# Patient Record
Sex: Male | Born: 1935 | Race: White | Hispanic: No | Marital: Married
Health system: Southern US, Community
[De-identification: ages and names within clinical notes are randomized; demographics above are authoritative.]

---

## 2004-03-06 ENCOUNTER — Ambulatory Visit: Payer: Self-pay | Admitting: Unknown Physician Specialty

## 2007-01-09 ENCOUNTER — Emergency Department: Payer: Self-pay | Admitting: Emergency Medicine

## 2007-01-09 ENCOUNTER — Other Ambulatory Visit: Payer: Self-pay

## 2007-08-22 ENCOUNTER — Ambulatory Visit: Payer: Self-pay | Admitting: Otolaryngology

## 2007-09-12 ENCOUNTER — Ambulatory Visit: Payer: Self-pay | Admitting: Family Medicine

## 2008-12-03 ENCOUNTER — Emergency Department: Payer: Self-pay | Admitting: Emergency Medicine

## 2009-02-13 ENCOUNTER — Observation Stay: Payer: Self-pay | Admitting: Internal Medicine

## 2009-04-09 ENCOUNTER — Emergency Department: Payer: Self-pay | Admitting: Emergency Medicine

## 2009-05-09 ENCOUNTER — Emergency Department: Payer: Self-pay | Admitting: Unknown Physician Specialty

## 2009-05-19 ENCOUNTER — Ambulatory Visit: Payer: Self-pay | Admitting: Unknown Physician Specialty

## 2010-08-19 ENCOUNTER — Emergency Department: Payer: Self-pay | Admitting: Emergency Medicine

## 2010-08-31 ENCOUNTER — Ambulatory Visit: Payer: Self-pay | Admitting: Unknown Physician Specialty

## 2010-09-06 IMAGING — CR DG CHEST 2V
1 series · 2 of 2 positions shown · non-contrast
Comparison: none

REASON FOR EXAM: short of breath  -  ed waiting room
COMMENTS:   May transport without cardiac monitor

PROCEDURE:     DXR - DXR CHEST PA (OR AP) AND LATERAL  - April 09, 2009  [DATE]
RESULT:     Comparison: 02/12/2009, 01/09/2007

[Series 1: view not recorded · 0.17mm/px · 2 of 2 slices shown]
[im 1/2]
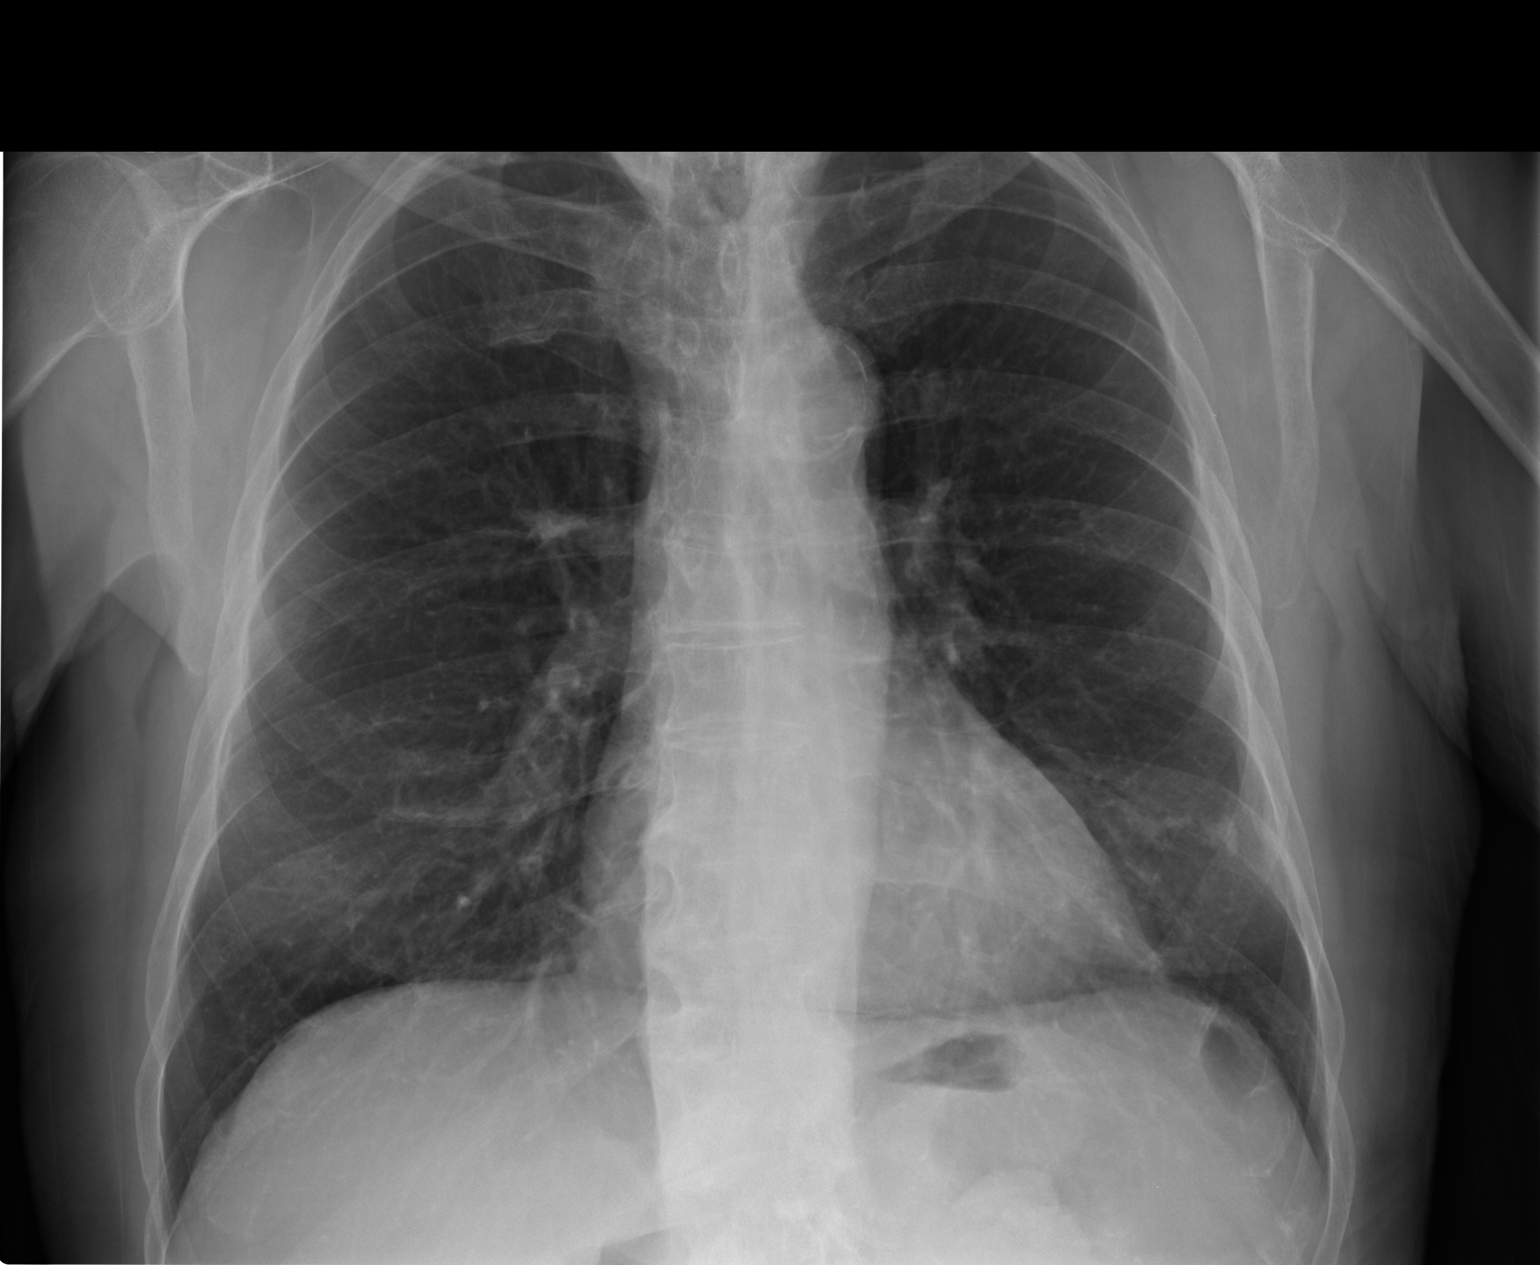
[im 2/2]
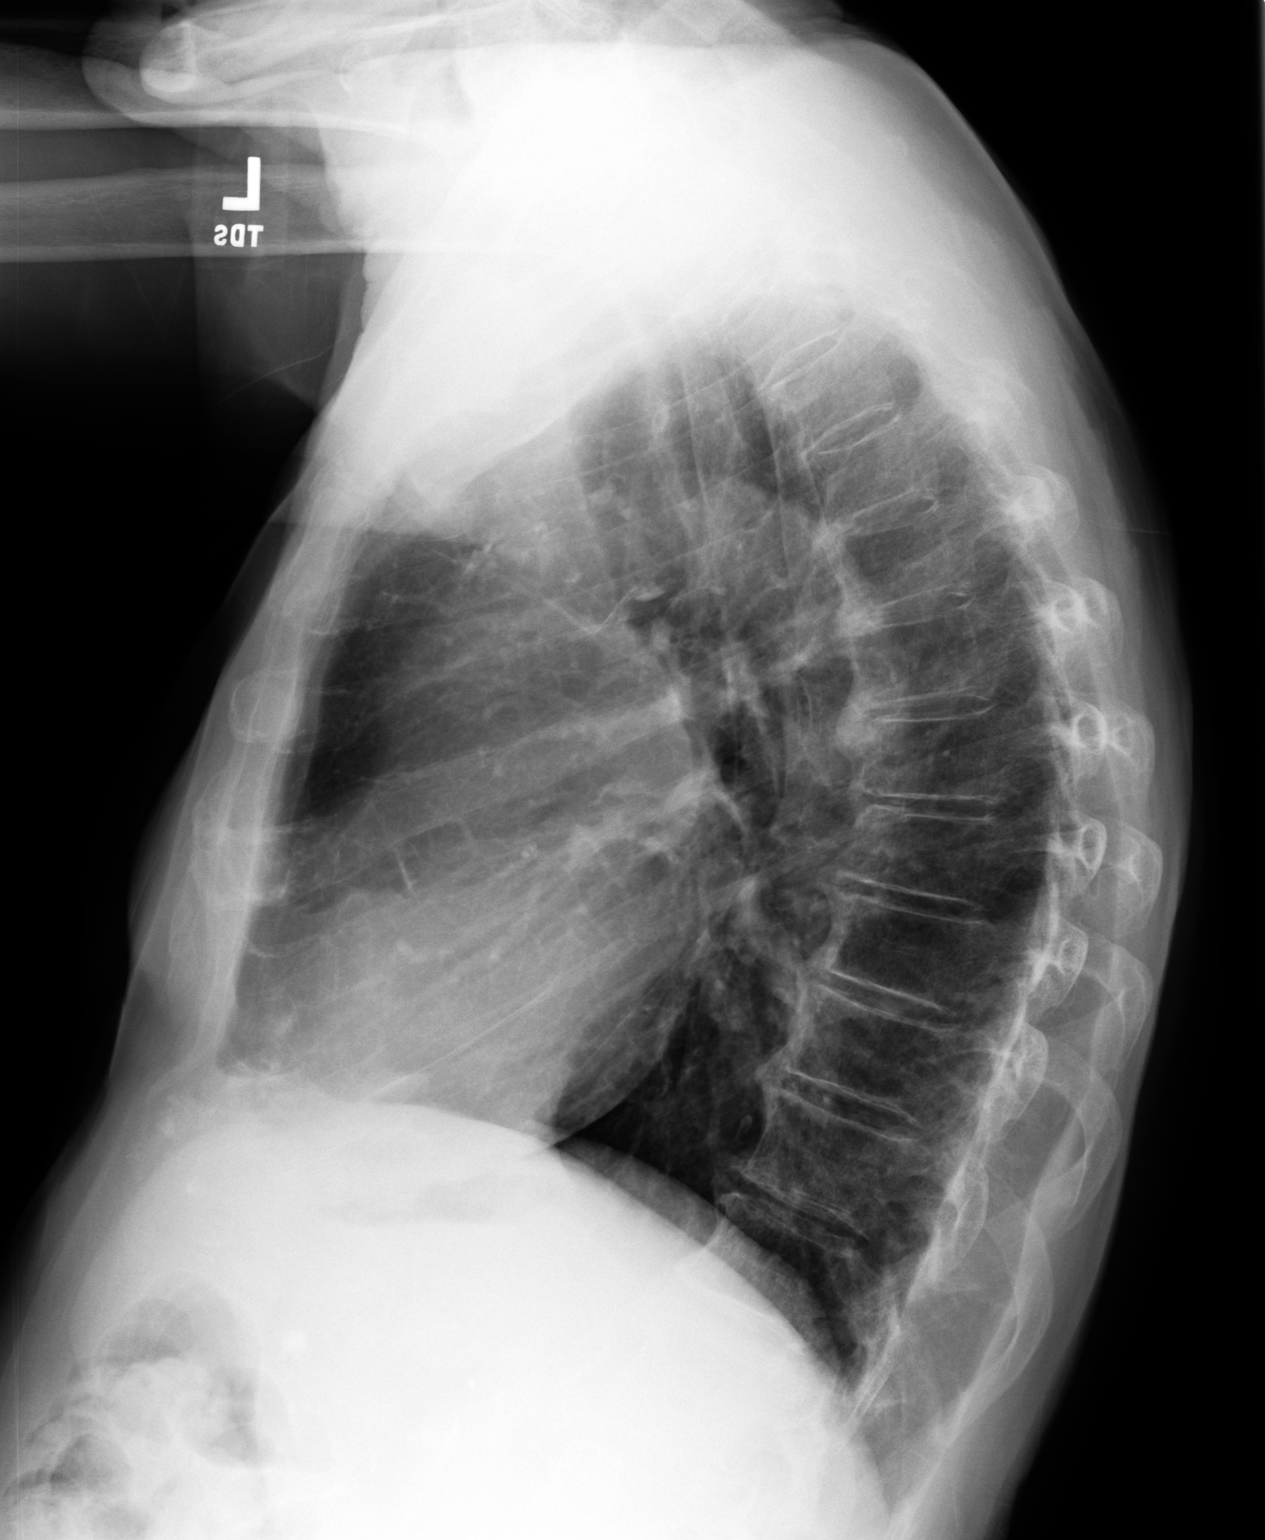

[2 of 2 positions shown; findings below may reference images not displayed]

FINDINGS: PA and lateral chest radiographs are provided. There is no focal parenchymal
opacity, pleural effusion, or pneumothorax. The heart and mediastinum are
unremarkable. The osseous structures are unremarkable.
IMPRESSION: No acute disease of the chest.

## 2010-11-16 ENCOUNTER — Inpatient Hospital Stay: Payer: Self-pay | Admitting: Orthopedic Surgery

## 2010-11-22 LAB — PATHOLOGY REPORT

## 2011-01-01 ENCOUNTER — Encounter: Payer: Self-pay | Admitting: Cardiothoracic Surgery

## 2011-01-01 ENCOUNTER — Encounter: Payer: Self-pay | Admitting: Nurse Practitioner

## 2011-01-19 ENCOUNTER — Inpatient Hospital Stay: Payer: Self-pay | Admitting: Specialist

## 2011-02-01 ENCOUNTER — Encounter: Payer: Self-pay | Admitting: Nurse Practitioner

## 2011-02-01 ENCOUNTER — Encounter: Payer: Self-pay | Admitting: Cardiothoracic Surgery

## 2011-03-03 ENCOUNTER — Encounter: Payer: Self-pay | Admitting: Cardiothoracic Surgery

## 2011-03-03 ENCOUNTER — Encounter: Payer: Self-pay | Admitting: Nurse Practitioner

## 2011-04-03 ENCOUNTER — Encounter: Payer: Self-pay | Admitting: Cardiothoracic Surgery

## 2011-04-03 ENCOUNTER — Encounter: Payer: Self-pay | Admitting: Nurse Practitioner

## 2011-04-14 LAB — WOUND CULTURE

## 2011-05-04 ENCOUNTER — Encounter: Payer: Self-pay | Admitting: Nurse Practitioner

## 2011-05-04 ENCOUNTER — Encounter: Payer: Self-pay | Admitting: Cardiothoracic Surgery

## 2011-06-01 ENCOUNTER — Encounter: Payer: Self-pay | Admitting: Nurse Practitioner

## 2011-06-01 ENCOUNTER — Encounter: Payer: Self-pay | Admitting: Cardiothoracic Surgery

## 2011-06-20 ENCOUNTER — Emergency Department: Payer: Self-pay | Admitting: Emergency Medicine

## 2011-06-20 LAB — COMPREHENSIVE METABOLIC PANEL
Albumin: 3.6 g/dL (ref 3.4–5.0)
Alkaline Phosphatase: 57 U/L (ref 50–136)
BUN: 14 mg/dL (ref 7–18)
Bilirubin,Total: 0.4 mg/dL (ref 0.2–1.0)
Calcium, Total: 8.9 mg/dL (ref 8.5–10.1)
Chloride: 97 mmol/L — ABNORMAL LOW (ref 98–107)
Co2: 26 mmol/L (ref 21–32)
Creatinine: 0.83 mg/dL (ref 0.60–1.30)
EGFR (Non-African Amer.): >60
Glucose: 201 mg/dL — ABNORMAL HIGH (ref 65–99)
Osmolality: 274 (ref 275–301)
Potassium: 3.8 mmol/L (ref 3.5–5.1)
SGOT(AST): 24 U/L (ref 15–37)
Sodium: 134 mmol/L — ABNORMAL LOW (ref 136–145)
Total Protein: 7 g/dL (ref 6.4–8.2)

## 2011-06-20 LAB — CBC
HCT: 33.9 % — ABNORMAL LOW (ref 40.0–52.0)
MCV: 89 fL (ref 80–100)
Platelet: 173 10*3/uL (ref 150–440)
RBC: 3.82 10*6/uL — ABNORMAL LOW (ref 4.40–5.90)
RDW: 12.9 % (ref 11.5–14.5)
WBC: 7.1 10*3/uL (ref 3.8–10.6)

## 2011-06-20 LAB — PROTIME-INR
INR: 1
Prothrombin Time: 13.7 secs (ref 11.5–14.7)

## 2011-06-20 LAB — CK TOTAL AND CKMB (NOT AT ARMC)
CK, Total: 65 U/L (ref 35–232)
CK-MB: 1.2 ng/mL (ref 0.5–3.6)

## 2011-06-20 LAB — URINALYSIS, COMPLETE
Bilirubin,UR: NEGATIVE
Glucose,UR: NEGATIVE mg/dL (ref 0–75)
Ketone: NEGATIVE
Nitrite: POSITIVE
Specific Gravity: 1.006 (ref 1.003–1.030)
Squamous Epithelial: <1

## 2011-06-20 LAB — TROPONIN I: Troponin-I: <0.02 ng/mL

## 2011-06-20 LAB — APTT: Activated PTT: 34.2 secs (ref 23.6–35.9)

## 2011-06-20 LAB — PRO B NATRIURETIC PEPTIDE: B-Type Natriuretic Peptide: 1076 pg/mL — ABNORMAL HIGH (ref 0–450)

## 2011-07-02 ENCOUNTER — Encounter: Payer: Self-pay | Admitting: Nurse Practitioner

## 2011-07-02 ENCOUNTER — Encounter: Payer: Self-pay | Admitting: Cardiothoracic Surgery

## 2011-09-14 ENCOUNTER — Emergency Department: Payer: Self-pay | Admitting: Unknown Physician Specialty

## 2011-09-14 LAB — URINALYSIS, COMPLETE
Bilirubin,UR: NEGATIVE
Glucose,UR: NEGATIVE mg/dL (ref 0–75)
Hyaline Cast: 1
Ketone: NEGATIVE
Nitrite: NEGATIVE
Ph: 7 (ref 4.5–8.0)
Protein: NEGATIVE
Specific Gravity: 1.008 (ref 1.003–1.030)
Squamous Epithelial: NONE SEEN
WBC UR: 10 /HPF (ref 0–5)

## 2011-09-14 LAB — CBC
HCT: 31 % — ABNORMAL LOW (ref 40.0–52.0)
MCH: 30.4 pg (ref 26.0–34.0)
Platelet: 222 10*3/uL (ref 150–440)
RBC: 3.52 10*6/uL — ABNORMAL LOW (ref 4.40–5.90)

## 2011-09-14 LAB — COMPREHENSIVE METABOLIC PANEL
Albumin: 3.5 g/dL (ref 3.4–5.0)
Anion Gap: 9 (ref 7–16)
BUN: 21 mg/dL — ABNORMAL HIGH (ref 7–18)
Bilirubin,Total: 0.4 mg/dL (ref 0.2–1.0)
Co2: 26 mmol/L (ref 21–32)
Creatinine: 0.97 mg/dL (ref 0.60–1.30)
EGFR (Non-African Amer.): >60
Glucose: 84 mg/dL (ref 65–99)
Osmolality: 270 (ref 275–301)
SGPT (ALT): 18 U/L
Sodium: 134 mmol/L — ABNORMAL LOW (ref 136–145)
Total Protein: 6.9 g/dL (ref 6.4–8.2)

## 2011-09-14 LAB — PROTIME-INR
INR: 1
Prothrombin Time: 13.1 secs (ref 11.5–14.7)

## 2011-09-17 LAB — URINE CULTURE

## 2011-09-24 ENCOUNTER — Ambulatory Visit: Payer: Self-pay | Admitting: Unknown Physician Specialty

## 2012-02-23 ENCOUNTER — Other Ambulatory Visit: Payer: Self-pay | Admitting: Internal Medicine

## 2012-05-03 DEATH — deceased

## 2012-11-16 IMAGING — CT CT HEAD WITHOUT CONTRAST
2 of 4 series · 16 of 30 positions shown, 19 images · non-contrast
Comparison: none

REASON FOR EXAM: weaker
COMMENTS:

PROCEDURE:     CT  - CT HEAD WITHOUT CONTRAST  - June 20, 2011 [DATE]
RESULT:     Head CT dated 06/20/2011
TECHNIQUE: Helical noncontrasted 5 mm sections were obtained from the skull
base to the vertex. This study was compared to previous study dated
02/13/2009.

[Series 2: without · axial · non-contrast · 0.39mm/px · z∈[+354,+478]mm · 11 of 31 slices shown, 14 images]
[im 3/31  brain]
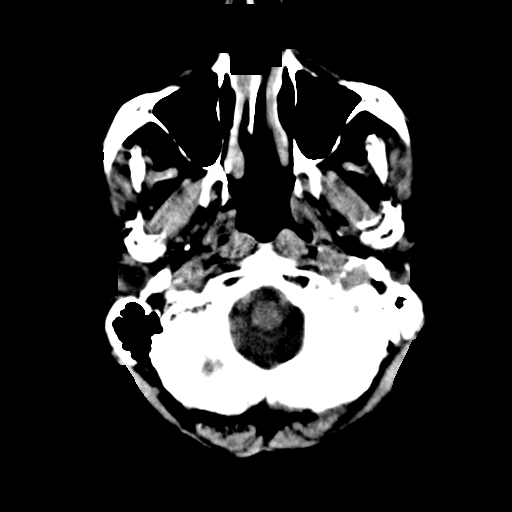
[im 3/31  bone]
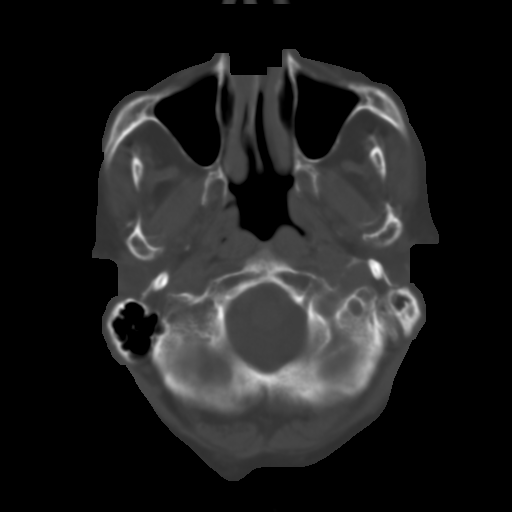
[im 6/31  brain]
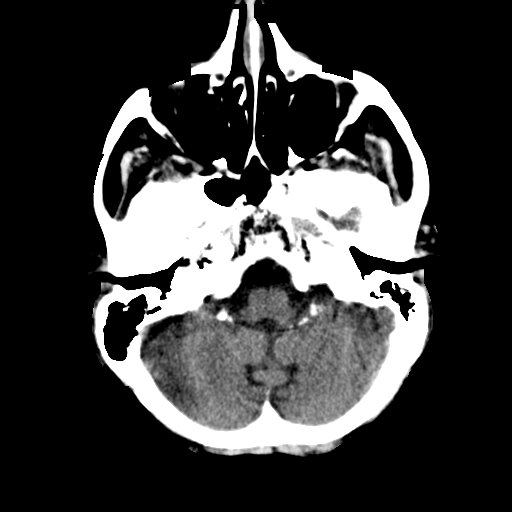
[im 8/31  brain]
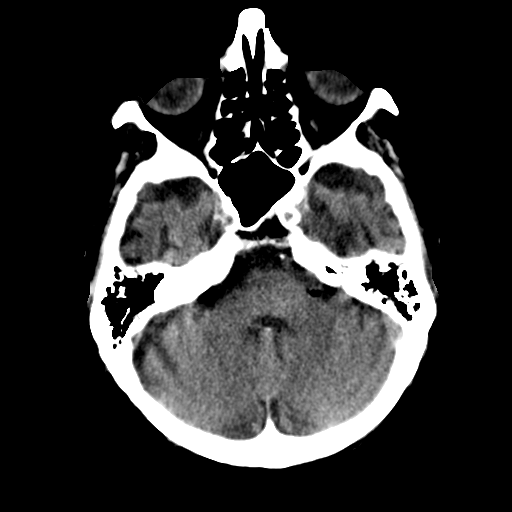
[im 11/31  brain]
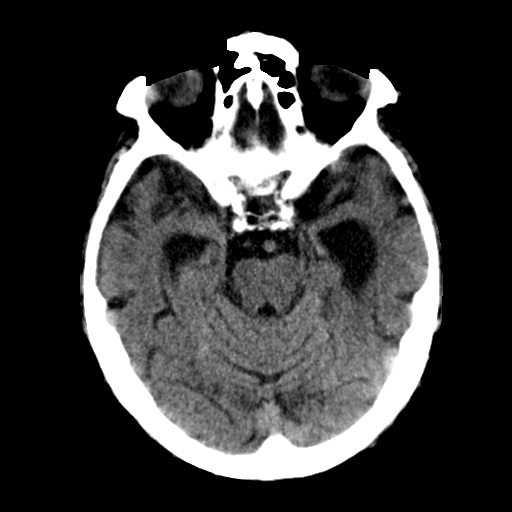
[im 13/31  brain]
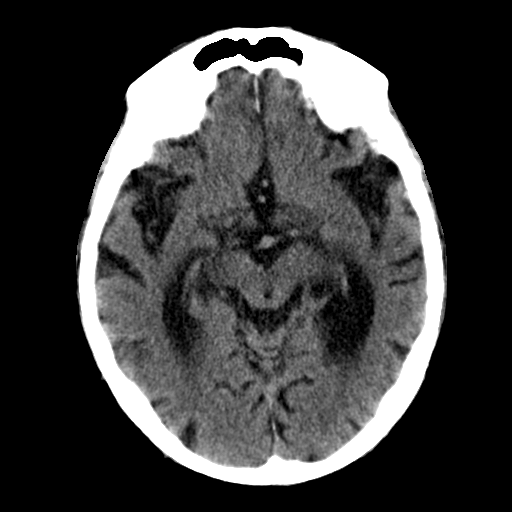
[im 13/31  bone]
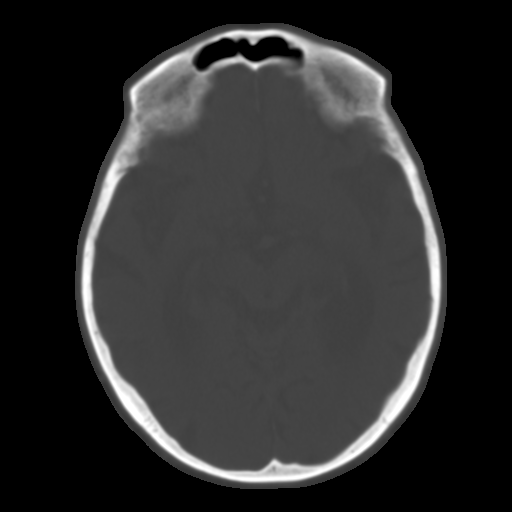
[im 16/31  brain]
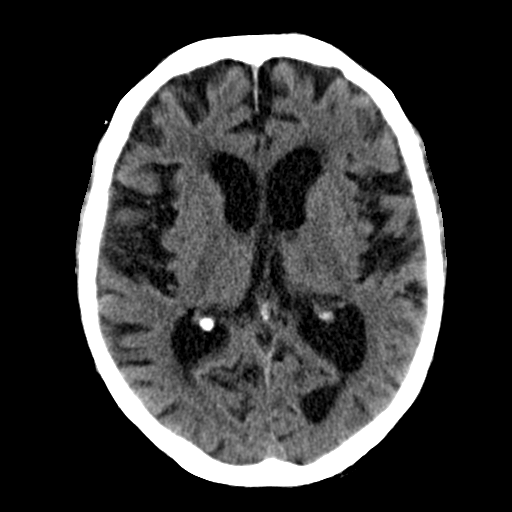
[im 18/31  brain]
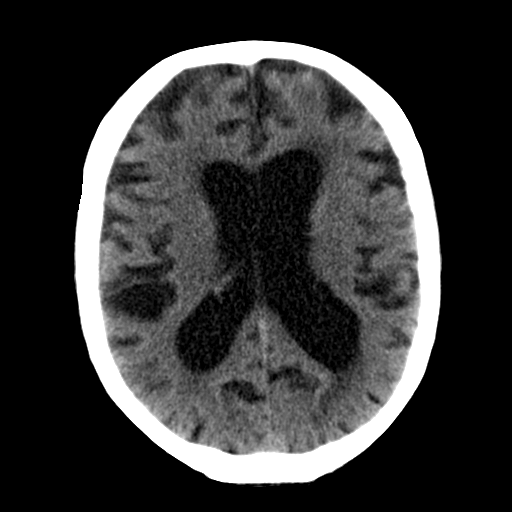
[im 21/31  brain]
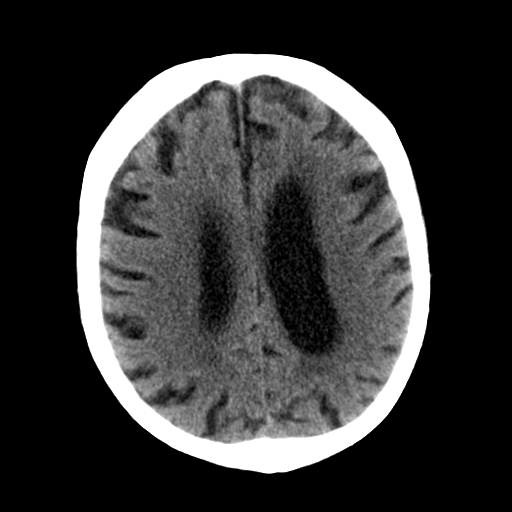
[im 23/31  brain]
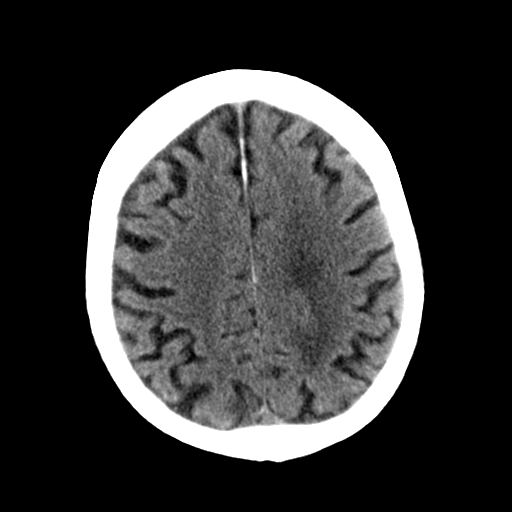
[im 23/31  bone]
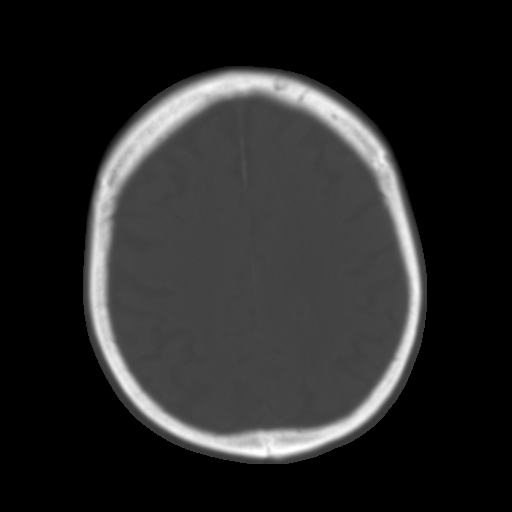
[im 26/31  brain]
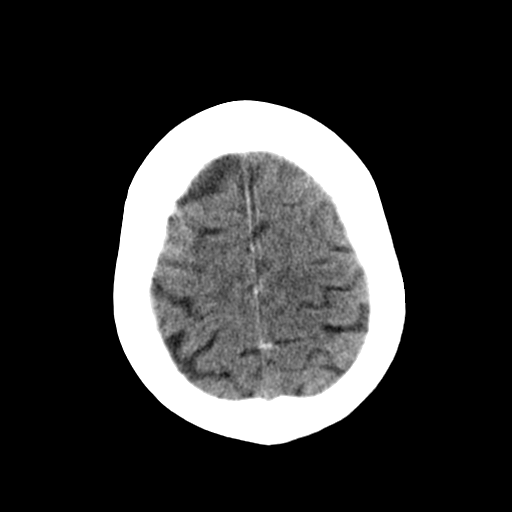
[im 28/31  brain]
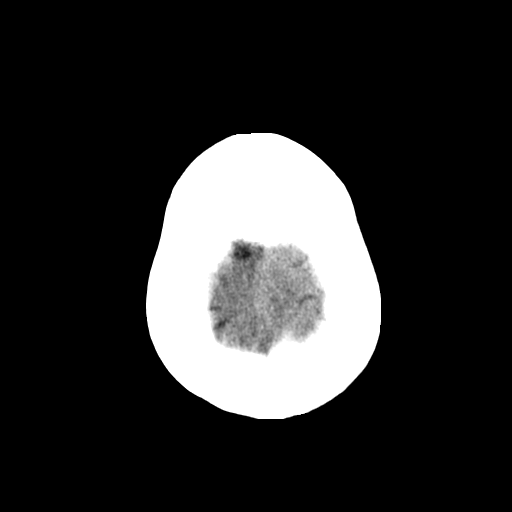

[Series 3: bone · axial · 0.39mm/px · z∈[+354,+428]mm · 5 of 31 slices shown]
[im 3/31  bone]
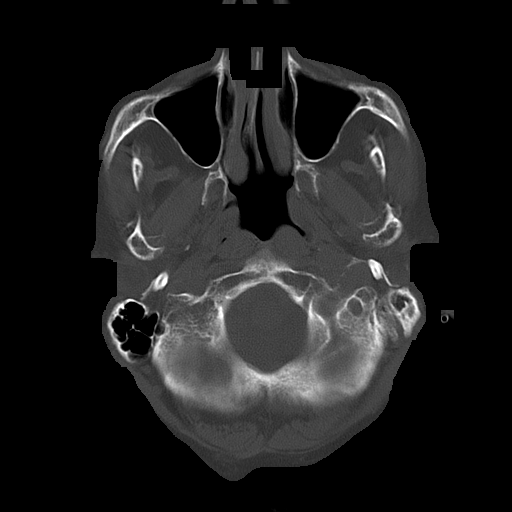
[im 8/31  bone]
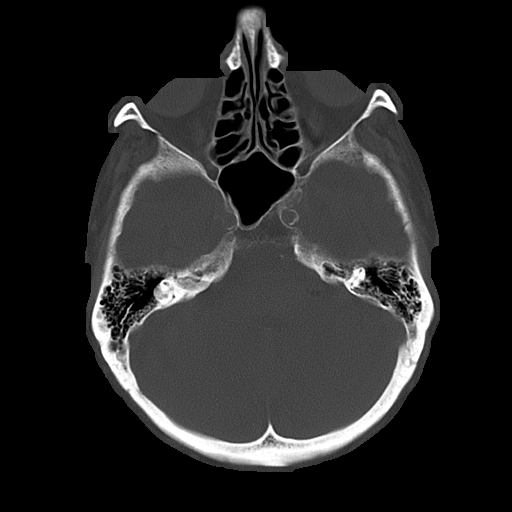
[im 11/31  bone]
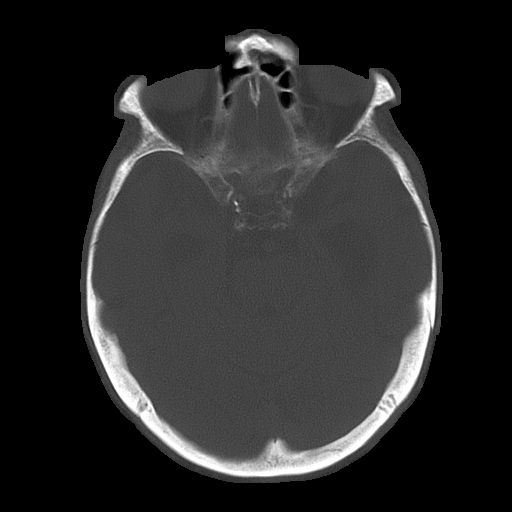
[im 13/31  bone]
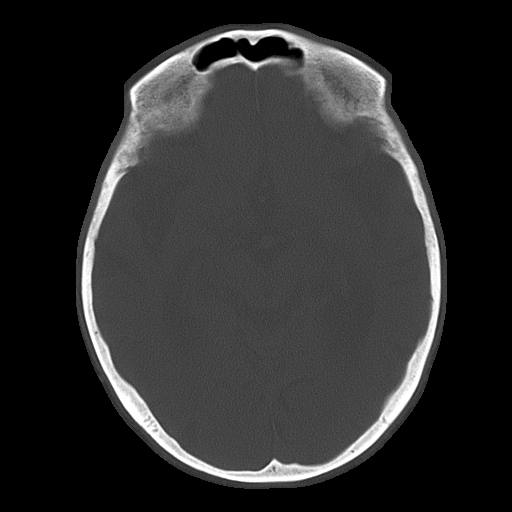
[im 18/31  bone]
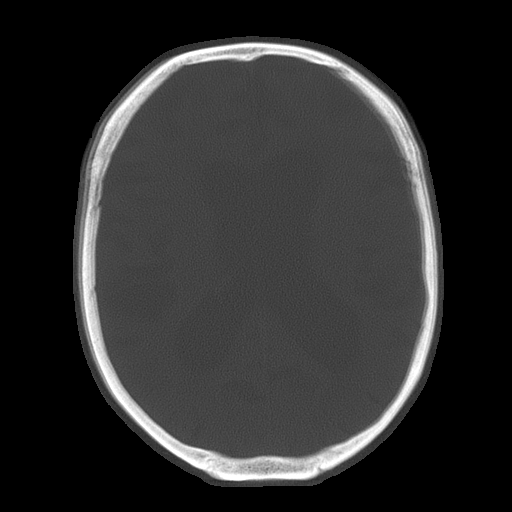

[16 of 30 positions shown; findings below may reference images not displayed]

FINDINGS: There is no evidence of intra-axial nor extra-axial fluid
collections nor evidence of acute hemorrhage. There is diffuse cortical
atrophy. Hydrocephalus ex vacuo is appreciated as well as diffuse areas of
low-attenuation within the subcortical, deep, and periventricular white
matter regions. There is no evidence of mass effect nor a depressed skull
fracture.
IMPRESSION: Involutional changes without evidence of acute
abnormalities.

## 2014-07-25 NOTE — Consult Note (Signed)
Brief Consult Note: Diagnosis: 1. Bronchitis 2. UTI 3. DM 4. Dementia 5. HTN 6. Anxiety.   Patient was seen by consultant.   Consult note dictated.   Discussed with Attending MD.   Comments: 81 yo male w/ hx of anxiety, dementia, DM, BPH, came into hospital due to ongoing cough, congestion and weakness.   1. Bronchitis/URI - slow to improve.  No evidence of pneumonia.  Afebrile and CXR (-).  - will d/c on some PO Levaquin, Tussionex.  2. UTI - PO Levaquin.  3. Weakness - likely related to # 1 & 2.  CT head (-).  4. Chest pain - atypical and related to increasing cough and URI.  Has been having on and off CP for months.  Likley muscular.  5. Dementia - cont. home meds 6. DM - cont. Glimeperide 7. BPH - cont. Avodart.   d/c home and follow up w/ pt's PCP and Urologist as outpatient.  Time Spent: 50 min.  Job # [id number].  Electronic Signatures: [place] SirenSainani, Vivek J (MD)  (Signed 20-Mar-13 14:06)  Authored: Brief Consult Note   Last Updated: 20-Mar-13 14:06 by [place] SirenSainani, Vivek J (MD)

## 2014-07-25 NOTE — Consult Note (Signed)
PATIENT NAME:  Edward Chan, [name] MR#:  [id number] DATE OF BIRTH:  May 06, 1924  DATE OF CONSULTATION:  06/20/2011  REFERRING PHYSICIAN:  Joseph ArtAlice Finnell, MD CONSULTING PHYSICIAN:  Rolly PancakeVivek J. Cherlynn KaiserSainani, MD  PRIMARY CARE PHYSICIAN:  Barry BrunnerGlenn Willett, MD  CHIEF COMPLAINT: Cough, congestion, and weakness.   HISTORY OF PRESENT ILLNESS: This is an 81 year old male who has underlying dementia, brought in by his granddaughter due to the fact that he has been having this cough, congestion, and upper respiratory infection symptoms for about four weeks. She says she has been a bit concerned that this is not going away and that he may have an underlying pneumonia; therefore, she brought him to the ER for further evaluation. She also says that the patient has been  increasingly weaker over the past few days. The patient also complained of some chest pain, although as per the granddaughter the patient has complained of chest pain on and off for a while. He was seen by Dr. Gwen PoundsKowalski in October of last year, also complaining of chest pain, but and he thought it was atypical in nature and started the patient on some reflux medications at that time. The patient presently is not complaining of any chest pain to me although his history is not very reliable presently. No other associated symptoms presently.   REVIEW OF SYSTEMS:  CONSTITUTIONAL: No documented fever. No weight gain or weight loss.  EYES: No blurry or double vision. ENT: No tinnitus. Positive postnasal drip. No redness of the oropharynx. RESPIRATORY: Positive cough. No wheeze. No hemoptysis. No shortness of breath.  CARDIOVASCULAR: No chest pain, no orthopnea, no palpitations, no syncope. GI: No nausea, no vomiting, no diarrhea, no abdominal pain, no melena or hematochezia. GU: No dysuria or hematuria. ENDOCRINE: No polyuria or nocturia. No heat or cold intolerance. HEME: No anemia, no bruising, no bleeding. INTEGUMENT: No rashes. No lesions. MUSCULOSKELETAL: No arthritis, no  swelling no gout. NEUROLOGIC: No numbness, no tingling, no ataxia, no seizure-type activity. PSYCH: No anxiety, no insomnia, no ADD.   PAST MEDICAL HISTORY: Dementia, diabetes, gastroesophageal reflux disease, benign prostatic hypertrophy, anxiety/depression.   ALLERGIES: Sulfa drugs.   SOCIAL HISTORY: Used to be a smoker and also used to drink but quit many, many years ago. No illicit drug abuse. Lives at home with his granddaughter.   FAMILY HISTORY: Significant for diabetes.   CURRENT MEDICATIONS:  1. Omeprazole 20 mg b.i.d.  2. Avodart 0.5 mg daily.  3. Celexa 10 mg daily.  4. Klonopin, 1/2 tab of 1 mg b.i.d.  5. Diclofenac 75 mg b.i.d.  6. Glimepiride 2 mg b.i.d.  7. Ibuprofen 400 mg as needed.  8. Namenda 10 mg b.i.d.  9. Trazodone 100 mg at bedtime.   PHYSICAL EXAMINATION:  VITAL SIGNS: Temperature 97.6, pulse 60, respirations 18, blood pressure 142/69, sats 99% on room air.   GENERAL: He is a pleasant appearing male in no apparent distress.   HEENT: Atraumatic, normocephalic. His extraocular muscles are intact. Pupils equal, reactive to light. Sclerae anicteric. No conjunctival injection. No oropharyngeal erythema.   NECK: Supple. No jugular venous distention. No bruits, no lymphadenopathy or thyromegaly.   HEART: Regular rate and rhythm. No murmurs, no rubs, no clicks.   LUNGS: Clear to auscultation bilaterally. No rales, no rhonchi, no wheezes.   ABDOMEN: Soft, flat, nontender, nondistended. Has good bowel sounds. No hepatosplenomegaly appreciated.   EXTREMITIES: There is no evidence of any cyanosis, clubbing, or peripheral edema. Has +2 pedal and radial pulses bilaterally.  NEUROLOGICAL: The patient is alert, awake, and oriented times three with no focal motor or sensory deficits appreciated bilaterally.   SKIN: Moist and warm with no rash appreciated.   LYMPHATIC: There is no cervical or axillary lymphadenopathy.  LABORATORY, DIAGNOSTIC, AND RADIOLOGICAL DATA:  Serum glucose 201, BUN 14, creatinine 0.8, sodium 134, potassium 3.8, chloride 97, bicarbonate 26. Liver function tests are within normal limits. Troponin 0.02. White cell count 7.1, hemoglobin 11.5, hematocrit 33.9, platelet count 173. Urinalysis shows 3+ leukocyte esterase, 66 white cells, and trace bacteria.   The patient did have a CT of the head done which showed no evidence of any acute intracranial abnormality and chest x-ray showing no acute cardiopulmonary disease. The patient's EKG shows normal sinus rhythm with normal axes, low voltage criteria, but no evidence of any acute ST or T wave changes.   ASSESSMENT AND PLAN: This is an 81 year old male with past medical history of diabetes, benign prostatic hypertrophy, dementia, anxiety, and gastroesophageal reflux disease who presented to the hospital with cough, congestion, and weakness.  1. Bronchitis/upper respiratory infection, which is slow to improve: I do not appreciate any evidence of superimposed pneumonia given that he is afebrile. He has a negative chest x-ray. Since the patient has not been getting better, I will start him on some p.o. Levaquin for the next seven days and also give him some Tussionex for his cough.  2. Urinary tract infection:  Based on his urinalysis I will go ahead and treat this with the Levaquin at this point.  3. Weakness: I think this is likely related to the underlying bronchitis and urinary tract infection. It should improve with treatment and antibiotics. I do not appreciate any evidence of neurologic cause as a CT of the head is negative.  4. Chest pain: I think this is likely muscular chest pain from his cough and his upper respiratory infection. He has had atypical chest pains on and off for a month as per daughter. He had been seen by Dr. Gwen Pounds in October who did not think this was cardiac chest pain. His troponins here are negative and his EKG does not show any ST-T wave changes.  5. Dementia: He will  continue his Namenda.  6. Diabetes: Continue glimepiride. 7. Benign prostatic hypertrophy: Continue his Avodart.  8. Gastroesophageal reflux disease: Continue with omeprazole.  9. CODE STATUS: The patient is a FULL CODE. 10. He is going to be discharged home with followup with his primary care physician and urologist as an outpatient.  TIME SPENT ON CONSULTATION:  50 minutes.    ____________________________ Rolly Pancake. Cherlynn Kaiser, MD [id number] D: 06/20/2011 14:06:08 ET T: 06/20/2011 15:09:19 ET JOB#: [id number]  cc: Rolly Pancake. Cherlynn Kaiser, MD, <Dictator> Jorje Guild. Beckey Downing, MD Houston Siren MD ELECTRONICALLY SIGNED 06/20/2011 17:59
